# Patient Record
Sex: Female | Born: 1960 | Race: White | Hispanic: No | Marital: Married | Smoking: Former smoker
Health system: Southern US, Community
[De-identification: ages and names within clinical notes are randomized; demographics above are authoritative.]

---

## 2016-11-03 ENCOUNTER — Other Ambulatory Visit (HOSPITAL_COMMUNITY): Payer: Self-pay

## 2016-11-03 ENCOUNTER — Inpatient Hospital Stay
Admission: AD | Admit: 2016-11-03 | Discharge: 2017-01-24 | Disposition: A | Discharge status: Skilled Nursing Facility | Payer: Self-pay | Source: Ambulatory Visit | Attending: Internal Medicine | Admitting: Internal Medicine

## 2016-11-03 DIAGNOSIS — J9690 Respiratory failure, unspecified, unspecified whether with hypoxia or hypercapnia: Secondary | ICD-10-CM

## 2016-11-03 DIAGNOSIS — Z931 Gastrostomy status: Secondary | ICD-10-CM

## 2016-11-03 DIAGNOSIS — R509 Fever, unspecified: Secondary | ICD-10-CM

## 2016-11-03 DIAGNOSIS — J189 Pneumonia, unspecified organism: Secondary | ICD-10-CM

## 2016-11-03 MED ORDER — IOPAMIDOL (ISOVUE-300) INJECTION 61%
INTRAVENOUS | Status: AC
  Administered 2016-11-03: 50 mL via GASTROSTOMY
  Filled 2016-11-03: qty 50

## 2016-11-04 LAB — COMPREHENSIVE METABOLIC PANEL
ALK PHOS: 70 U/L (ref 38–126)
ALT: 47 U/L (ref 14–54)
AST: 27 U/L (ref 15–41)
Albumin: 2.7 g/dL — ABNORMAL LOW (ref 3.5–5.0)
Anion gap: 5 (ref 5–15)
BILIRUBIN TOTAL: 0.4 mg/dL (ref 0.3–1.2)
BUN: 11 mg/dL (ref 6–20)
CALCIUM: 8.8 mg/dL — AB (ref 8.9–10.3)
CO2: 32 mmol/L (ref 22–32)
Chloride: 97 mmol/L — ABNORMAL LOW (ref 101–111)
Glucose, Bld: 80 mg/dL (ref 65–99)
Potassium: 3.4 mmol/L — ABNORMAL LOW (ref 3.5–5.1)
Sodium: 134 mmol/L — ABNORMAL LOW (ref 135–145)
TOTAL PROTEIN: 7.7 g/dL (ref 6.5–8.1)

## 2016-11-04 LAB — TSH: TSH: 14.104 u[IU]/mL — AB (ref 0.350–4.500)

## 2016-11-06 LAB — CBC WITH DIFFERENTIAL/PLATELET
BASOS ABS: 0 10*3/uL (ref 0.0–0.1)
Basophils Relative: 0 %
EOS ABS: 0.2 10*3/uL (ref 0.0–0.7)
EOS PCT: 2 %
HCT: 31.7 % — ABNORMAL LOW (ref 36.0–46.0)
HEMOGLOBIN: 9.8 g/dL — AB (ref 12.0–15.0)
Lymphocytes Relative: 24 %
Lymphs Abs: 2.3 10*3/uL (ref 0.7–4.0)
MCH: 28 pg (ref 26.0–34.0)
MCHC: 30.9 g/dL (ref 30.0–36.0)
MCV: 90.6 fL (ref 78.0–100.0)
Monocytes Absolute: 0.8 10*3/uL (ref 0.1–1.0)
Monocytes Relative: 8 %
NEUTROS PCT: 66 %
Neutro Abs: 6.2 10*3/uL (ref 1.7–7.7)
PLATELETS: 81 10*3/uL — AB (ref 150–400)
RBC: 3.5 MIL/uL — AB (ref 3.87–5.11)
RDW: 13.6 % (ref 11.5–15.5)
WBC: 9.4 10*3/uL (ref 4.0–10.5)

## 2016-11-06 LAB — BASIC METABOLIC PANEL
Anion gap: 7 (ref 5–15)
BUN: 11 mg/dL (ref 6–20)
CO2: 34 mmol/L — ABNORMAL HIGH (ref 22–32)
Calcium: 8.6 mg/dL — ABNORMAL LOW (ref 8.9–10.3)
Chloride: 96 mmol/L — ABNORMAL LOW (ref 101–111)
Glucose, Bld: 146 mg/dL — ABNORMAL HIGH (ref 65–99)
POTASSIUM: 3.5 mmol/L (ref 3.5–5.1)
SODIUM: 137 mmol/L (ref 135–145)

## 2016-11-06 LAB — T4, FREE: FREE T4: 1.09 ng/dL (ref 0.61–1.12)

## 2016-11-07 ENCOUNTER — Other Ambulatory Visit (HOSPITAL_COMMUNITY): Payer: Self-pay

## 2016-11-07 LAB — URINALYSIS, ROUTINE W REFLEX MICROSCOPIC
Bilirubin Urine: NEGATIVE
Glucose, UA: NEGATIVE mg/dL
Ketones, ur: NEGATIVE mg/dL
Nitrite: NEGATIVE
Protein, ur: 100 mg/dL — AB
Specific Gravity, Urine: 1.011 (ref 1.005–1.030)
pH: 7 (ref 5.0–8.0)

## 2016-11-09 LAB — URINE CULTURE: Culture: >=100000 — AB

## 2016-11-12 LAB — CULTURE, BLOOD (ROUTINE X 2)
Culture: NO GROWTH
Culture: NO GROWTH

## 2016-11-13 ENCOUNTER — Other Ambulatory Visit (HOSPITAL_COMMUNITY): Payer: Self-pay

## 2016-11-13 LAB — BASIC METABOLIC PANEL
ANION GAP: 6 (ref 5–15)
Anion gap: 9 (ref 5–15)
BUN: 11 mg/dL (ref 6–20)
BUN: 15 mg/dL (ref 6–20)
CO2: 32 mmol/L (ref 22–32)
CO2: 36 mmol/L — ABNORMAL HIGH (ref 22–32)
CREATININE: 0.32 mg/dL — AB (ref 0.44–1.00)
Calcium: 10.4 mg/dL — ABNORMAL HIGH (ref 8.9–10.3)
Calcium: 8.8 mg/dL — ABNORMAL LOW (ref 8.9–10.3)
Chloride: 95 mmol/L — ABNORMAL LOW (ref 101–111)
Chloride: 112 mmol/L — ABNORMAL HIGH (ref 101–111)
GFR calc Af Amer: >60 mL/min (ref >60)
GFR calc non Af Amer: >60 mL/min (ref >60)
GLUCOSE: 123 mg/dL — AB (ref 65–99)
Glucose, Bld: 92 mg/dL (ref 65–99)
Potassium: 3.6 mmol/L (ref 3.5–5.1)
Potassium: 3.2 mmol/L — ABNORMAL LOW (ref 3.5–5.1)
SODIUM: 136 mmol/L (ref 135–145)
Sodium: 154 mmol/L — ABNORMAL HIGH (ref 135–145)

## 2016-11-13 LAB — CBC
HCT: 34.7 % — ABNORMAL LOW (ref 36.0–46.0)
Hemoglobin: 9.9 g/dL — ABNORMAL LOW (ref 12.0–15.0)
MCH: 28 pg (ref 26.0–34.0)
MCHC: 28.5 g/dL — AB (ref 30.0–36.0)
MCV: 98 fL (ref 78.0–100.0)
PLATELETS: 108 10*3/uL — AB (ref 150–400)
RBC: 3.54 MIL/uL — ABNORMAL LOW (ref 3.87–5.11)
RDW: 14 % (ref 11.5–15.5)
WBC: 8 10*3/uL (ref 4.0–10.5)

## 2016-11-18 LAB — BASIC METABOLIC PANEL
Anion gap: 10 (ref 5–15)
BUN: 18 mg/dL (ref 6–20)
CALCIUM: 9 mg/dL (ref 8.9–10.3)
CO2: 35 mmol/L — ABNORMAL HIGH (ref 22–32)
Chloride: 112 mmol/L — ABNORMAL HIGH (ref 101–111)
Creatinine, Ser: <0.3 mg/dL — ABNORMAL LOW (ref 0.44–1.00)
GLUCOSE: 221 mg/dL — AB (ref 65–99)
POTASSIUM: 3.4 mmol/L — AB (ref 3.5–5.1)
SODIUM: 157 mmol/L — AB (ref 135–145)

## 2016-11-18 LAB — CBC
HCT: 36 % (ref 36.0–46.0)
Hemoglobin: 9.8 g/dL — ABNORMAL LOW (ref 12.0–15.0)
MCH: 27.3 pg (ref 26.0–34.0)
MCHC: 27.2 g/dL — ABNORMAL LOW (ref 30.0–36.0)
MCV: 100.3 fL — ABNORMAL HIGH (ref 78.0–100.0)
PLATELETS: 187 10*3/uL (ref 150–400)
RBC: 3.59 MIL/uL — AB (ref 3.87–5.11)
RDW: 14.1 % (ref 11.5–15.5)
WBC: 9.5 10*3/uL (ref 4.0–10.5)

## 2016-11-20 LAB — BASIC METABOLIC PANEL
ANION GAP: 9 (ref 5–15)
BUN: 17 mg/dL (ref 6–20)
CALCIUM: 8.6 mg/dL — AB (ref 8.9–10.3)
CO2: 32 mmol/L (ref 22–32)
Chloride: 111 mmol/L (ref 101–111)
Creatinine, Ser: <0.3 mg/dL — ABNORMAL LOW (ref 0.44–1.00)
Glucose, Bld: 186 mg/dL — ABNORMAL HIGH (ref 65–99)
POTASSIUM: 3.3 mmol/L — AB (ref 3.5–5.1)
SODIUM: 152 mmol/L — AB (ref 135–145)

## 2016-11-21 LAB — CBC
HCT: 35 % — ABNORMAL LOW (ref 36.0–46.0)
HEMOGLOBIN: 10.1 g/dL — AB (ref 12.0–15.0)
MCH: 28 pg (ref 26.0–34.0)
MCHC: 28.9 g/dL — ABNORMAL LOW (ref 30.0–36.0)
MCV: 97 fL (ref 78.0–100.0)
Platelets: 233 10*3/uL (ref 150–400)
RBC: 3.61 MIL/uL — AB (ref 3.87–5.11)
RDW: 13.7 % (ref 11.5–15.5)
WBC: 9.9 10*3/uL (ref 4.0–10.5)

## 2016-11-21 LAB — TSH: TSH: 12.301 u[IU]/mL — AB (ref 0.350–4.500)

## 2016-11-21 LAB — BASIC METABOLIC PANEL
ANION GAP: 9 (ref 5–15)
BUN: 14 mg/dL (ref 6–20)
CHLORIDE: 108 mmol/L (ref 101–111)
CO2: 34 mmol/L — ABNORMAL HIGH (ref 22–32)
Calcium: 8.7 mg/dL — ABNORMAL LOW (ref 8.9–10.3)
Creatinine, Ser: <0.3 mg/dL — ABNORMAL LOW (ref 0.44–1.00)
Glucose, Bld: 291 mg/dL — ABNORMAL HIGH (ref 65–99)
POTASSIUM: 3.5 mmol/L (ref 3.5–5.1)
SODIUM: 151 mmol/L — AB (ref 135–145)

## 2016-11-22 LAB — URINE CULTURE: CULTURE: NO GROWTH

## 2016-11-23 LAB — COMPREHENSIVE METABOLIC PANEL
ALK PHOS: 81 U/L (ref 38–126)
ALT: 30 U/L (ref 14–54)
ANION GAP: 7 (ref 5–15)
AST: 21 U/L (ref 15–41)
Albumin: 2.4 g/dL — ABNORMAL LOW (ref 3.5–5.0)
BILIRUBIN TOTAL: 0.3 mg/dL (ref 0.3–1.2)
BUN: 16 mg/dL (ref 6–20)
CO2: 33 mmol/L — AB (ref 22–32)
Calcium: 8.7 mg/dL — ABNORMAL LOW (ref 8.9–10.3)
Chloride: 103 mmol/L (ref 101–111)
Creatinine, Ser: 0.32 mg/dL — ABNORMAL LOW (ref 0.44–1.00)
GFR calc Af Amer: >60 mL/min (ref >60)
GFR calc non Af Amer: >60 mL/min (ref >60)
GLUCOSE: 193 mg/dL — AB (ref 65–99)
Potassium: 3.4 mmol/L — ABNORMAL LOW (ref 3.5–5.1)
SODIUM: 143 mmol/L (ref 135–145)
Total Protein: 6.1 g/dL — ABNORMAL LOW (ref 6.5–8.1)

## 2016-11-23 LAB — CBC
HEMATOCRIT: 35 % — AB (ref 36.0–46.0)
HEMOGLOBIN: 10.3 g/dL — AB (ref 12.0–15.0)
MCH: 27.8 pg (ref 26.0–34.0)
MCHC: 29.4 g/dL — AB (ref 30.0–36.0)
MCV: 94.6 fL (ref 78.0–100.0)
Platelets: 208 10*3/uL (ref 150–400)
RBC: 3.7 MIL/uL — AB (ref 3.87–5.11)
RDW: 13.7 % (ref 11.5–15.5)
WBC: 11.2 10*3/uL — ABNORMAL HIGH (ref 4.0–10.5)

## 2016-11-24 LAB — BASIC METABOLIC PANEL
ANION GAP: 8 (ref 5–15)
BUN: 13 mg/dL (ref 6–20)
CALCIUM: 8.6 mg/dL — AB (ref 8.9–10.3)
CO2: 31 mmol/L (ref 22–32)
Chloride: 105 mmol/L (ref 101–111)
Creatinine, Ser: <0.3 mg/dL — ABNORMAL LOW (ref 0.44–1.00)
GLUCOSE: 224 mg/dL — AB (ref 65–99)
Potassium: 4 mmol/L (ref 3.5–5.1)
Sodium: 144 mmol/L (ref 135–145)

## 2016-11-24 LAB — MAGNESIUM: Magnesium: 2.3 mg/dL (ref 1.7–2.4)

## 2016-11-25 LAB — BASIC METABOLIC PANEL
ANION GAP: 7 (ref 5–15)
BUN: 10 mg/dL (ref 6–20)
CO2: 32 mmol/L (ref 22–32)
Calcium: 8.6 mg/dL — ABNORMAL LOW (ref 8.9–10.3)
Chloride: 105 mmol/L (ref 101–111)
Creatinine, Ser: <0.3 mg/dL — ABNORMAL LOW (ref 0.44–1.00)
Glucose, Bld: 224 mg/dL — ABNORMAL HIGH (ref 65–99)
POTASSIUM: 3.7 mmol/L (ref 3.5–5.1)
SODIUM: 144 mmol/L (ref 135–145)

## 2016-11-27 LAB — BASIC METABOLIC PANEL
ANION GAP: 7 (ref 5–15)
BUN: 12 mg/dL (ref 6–20)
CALCIUM: 8.6 mg/dL — AB (ref 8.9–10.3)
CHLORIDE: 102 mmol/L (ref 101–111)
CO2: 32 mmol/L (ref 22–32)
Glucose, Bld: 219 mg/dL — ABNORMAL HIGH (ref 65–99)
Potassium: 3.8 mmol/L (ref 3.5–5.1)
SODIUM: 141 mmol/L (ref 135–145)

## 2016-11-27 LAB — CBC WITH DIFFERENTIAL/PLATELET
BASOS PCT: 0 %
Basophils Absolute: 0 10*3/uL (ref 0.0–0.1)
EOS ABS: 0.1 10*3/uL (ref 0.0–0.7)
Eosinophils Relative: 2 %
HEMATOCRIT: 34.7 % — AB (ref 36.0–46.0)
HEMOGLOBIN: 10.1 g/dL — AB (ref 12.0–15.0)
Lymphocytes Relative: 19 %
Lymphs Abs: 1.7 10*3/uL (ref 0.7–4.0)
MCH: 27.2 pg (ref 26.0–34.0)
MCHC: 29.1 g/dL — AB (ref 30.0–36.0)
MCV: 93.5 fL (ref 78.0–100.0)
MONOS PCT: 9 %
Monocytes Absolute: 0.8 10*3/uL (ref 0.1–1.0)
NEUTROS ABS: 6.3 10*3/uL (ref 1.7–7.7)
NEUTROS PCT: 70 %
Platelets: 214 10*3/uL (ref 150–400)
RBC: 3.71 MIL/uL — ABNORMAL LOW (ref 3.87–5.11)
RDW: 14 % (ref 11.5–15.5)
WBC: 9 10*3/uL (ref 4.0–10.5)

## 2016-12-02 LAB — TSH: TSH: 12.128 u[IU]/mL — ABNORMAL HIGH (ref 0.350–4.500)

## 2016-12-02 LAB — URINALYSIS, ROUTINE W REFLEX MICROSCOPIC
BILIRUBIN URINE: NEGATIVE
GLUCOSE, UA: NEGATIVE mg/dL
HGB URINE DIPSTICK: NEGATIVE
KETONES UR: NEGATIVE mg/dL
Leukocytes, UA: NEGATIVE
NITRITE: NEGATIVE
PH: 9 — AB (ref 5.0–8.0)
Protein, ur: NEGATIVE mg/dL
SPECIFIC GRAVITY, URINE: 1.01 (ref 1.005–1.030)

## 2016-12-08 ENCOUNTER — Other Ambulatory Visit (HOSPITAL_COMMUNITY): Payer: Self-pay

## 2016-12-08 LAB — CBC
HEMATOCRIT: 38.7 % (ref 36.0–46.0)
Hemoglobin: 11.5 g/dL — ABNORMAL LOW (ref 12.0–15.0)
MCH: 27.5 pg (ref 26.0–34.0)
MCHC: 29.7 g/dL — ABNORMAL LOW (ref 30.0–36.0)
MCV: 92.6 fL (ref 78.0–100.0)
PLATELETS: 214 10*3/uL (ref 150–400)
RBC: 4.18 MIL/uL (ref 3.87–5.11)
RDW: 13.7 % (ref 11.5–15.5)
WBC: 12.9 10*3/uL — ABNORMAL HIGH (ref 4.0–10.5)

## 2016-12-08 LAB — BASIC METABOLIC PANEL
ANION GAP: 11 (ref 5–15)
BUN: 14 mg/dL (ref 6–20)
CALCIUM: 9.2 mg/dL (ref 8.9–10.3)
CO2: 35 mmol/L — AB (ref 22–32)
Chloride: 98 mmol/L — ABNORMAL LOW (ref 101–111)
Creatinine, Ser: <0.3 mg/dL — ABNORMAL LOW (ref 0.44–1.00)
GLUCOSE: 159 mg/dL — AB (ref 65–99)
Potassium: 3.6 mmol/L (ref 3.5–5.1)
Sodium: 144 mmol/L (ref 135–145)

## 2016-12-11 ENCOUNTER — Other Ambulatory Visit (HOSPITAL_COMMUNITY): Payer: Self-pay

## 2016-12-11 LAB — BASIC METABOLIC PANEL
Anion gap: 10 (ref 5–15)
BUN: 15 mg/dL (ref 6–20)
CHLORIDE: 98 mmol/L — AB (ref 101–111)
CO2: 35 mmol/L — AB (ref 22–32)
Calcium: 9.3 mg/dL (ref 8.9–10.3)
Creatinine, Ser: <0.3 mg/dL — ABNORMAL LOW (ref 0.44–1.00)
Glucose, Bld: 159 mg/dL — ABNORMAL HIGH (ref 65–99)
Potassium: 3.3 mmol/L — ABNORMAL LOW (ref 3.5–5.1)
Sodium: 143 mmol/L (ref 135–145)

## 2016-12-11 LAB — CBC
HEMATOCRIT: 39.5 % (ref 36.0–46.0)
HEMOGLOBIN: 12.1 g/dL (ref 12.0–15.0)
MCH: 28.3 pg (ref 26.0–34.0)
MCHC: 30.6 g/dL (ref 30.0–36.0)
MCV: 92.5 fL (ref 78.0–100.0)
Platelets: 186 10*3/uL (ref 150–400)
RBC: 4.27 MIL/uL (ref 3.87–5.11)
RDW: 14.1 % (ref 11.5–15.5)
WBC: 12.3 10*3/uL — ABNORMAL HIGH (ref 4.0–10.5)

## 2016-12-19 ENCOUNTER — Other Ambulatory Visit (HOSPITAL_COMMUNITY): Payer: Self-pay

## 2016-12-19 LAB — CBC
HCT: 44.4 % (ref 36.0–46.0)
HEMOGLOBIN: 12.3 g/dL (ref 12.0–15.0)
MCH: 27.7 pg (ref 26.0–34.0)
MCHC: 27.7 g/dL — AB (ref 30.0–36.0)
MCV: 100 fL (ref 78.0–100.0)
PLATELETS: 251 10*3/uL (ref 150–400)
RBC: 4.44 MIL/uL (ref 3.87–5.11)
RDW: 14.3 % (ref 11.5–15.5)
WBC: 10.4 10*3/uL (ref 4.0–10.5)

## 2016-12-19 LAB — BASIC METABOLIC PANEL
Anion gap: 7 (ref 5–15)
BUN: 16 mg/dL (ref 6–20)
CALCIUM: 9.7 mg/dL (ref 8.9–10.3)
CHLORIDE: 99 mmol/L — AB (ref 101–111)
CO2: 44 mmol/L — ABNORMAL HIGH (ref 22–32)
CREATININE: 0.3 mg/dL — AB (ref 0.44–1.00)
GFR calc Af Amer: >60 mL/min (ref >60)
GFR calc non Af Amer: >60 mL/min (ref >60)
Glucose, Bld: 191 mg/dL — ABNORMAL HIGH (ref 65–99)
Potassium: 4 mmol/L (ref 3.5–5.1)
SODIUM: 150 mmol/L — AB (ref 135–145)

## 2016-12-20 ENCOUNTER — Other Ambulatory Visit (HOSPITAL_COMMUNITY): Payer: Self-pay

## 2016-12-20 LAB — BLOOD GAS, ARTERIAL
ACID-BASE EXCESS: 17.1 mmol/L — AB (ref 0.0–2.0)
Acid-base deficit: 17 mmol/L — ABNORMAL HIGH (ref 0.0–2.0)
BICARBONATE: 45.6 mmol/L — AB (ref 20.0–28.0)
Bicarbonate: 40.1 mmol/L — ABNORMAL HIGH (ref 20.0–28.0)
FIO2: 100
FIO2: 50
MECHVT: 450 mL
O2 SAT: 98.9 %
O2 Saturation: 99.3 %
PATIENT TEMPERATURE: 98.6
PCO2 ART: 32.2 mmHg (ref 32.0–48.0)
PEEP/CPAP: 5 cmH2O
PO2 ART: 244 mmHg — AB (ref 83.0–108.0)
PO2 ART: 89.7 mmHg (ref 83.0–108.0)
Patient temperature: 98.6
RATE: 25 resp/min
pH, Arterial: 7.187 — CL (ref 7.350–7.450)
pH, Arterial: 7.693 (ref 7.350–7.450)

## 2016-12-20 LAB — CBC
HEMATOCRIT: 40.7 % (ref 36.0–46.0)
Hemoglobin: 10.9 g/dL — ABNORMAL LOW (ref 12.0–15.0)
MCH: 26.8 pg (ref 26.0–34.0)
MCHC: 26.8 g/dL — ABNORMAL LOW (ref 30.0–36.0)
MCV: 100 fL (ref 78.0–100.0)
Platelets: 278 10*3/uL (ref 150–400)
RBC: 4.07 MIL/uL (ref 3.87–5.11)
RDW: 14.4 % (ref 11.5–15.5)
WBC: 12.3 10*3/uL — ABNORMAL HIGH (ref 4.0–10.5)

## 2016-12-20 LAB — COMPREHENSIVE METABOLIC PANEL
ALK PHOS: 92 U/L (ref 38–126)
ALT: 22 U/L (ref 14–54)
ANION GAP: 14 (ref 5–15)
AST: 23 U/L (ref 15–41)
Albumin: 2.9 g/dL — ABNORMAL LOW (ref 3.5–5.0)
BILIRUBIN TOTAL: 0.9 mg/dL (ref 0.3–1.2)
BUN: 33 mg/dL — ABNORMAL HIGH (ref 6–20)
CALCIUM: 9.9 mg/dL (ref 8.9–10.3)
CO2: 36 mmol/L — ABNORMAL HIGH (ref 22–32)
Chloride: 99 mmol/L — ABNORMAL LOW (ref 101–111)
Creatinine, Ser: 0.77 mg/dL (ref 0.44–1.00)
Glucose, Bld: 223 mg/dL — ABNORMAL HIGH (ref 65–99)
Potassium: 4.6 mmol/L (ref 3.5–5.1)
Sodium: 149 mmol/L — ABNORMAL HIGH (ref 135–145)
TOTAL PROTEIN: 6.8 g/dL (ref 6.5–8.1)

## 2016-12-21 ENCOUNTER — Other Ambulatory Visit (HOSPITAL_COMMUNITY): Payer: Self-pay

## 2016-12-21 LAB — URINALYSIS, MICROSCOPIC (REFLEX)

## 2016-12-21 LAB — BASIC METABOLIC PANEL
ANION GAP: 8 (ref 5–15)
BUN: 43 mg/dL — ABNORMAL HIGH (ref 6–20)
CALCIUM: 9.1 mg/dL (ref 8.9–10.3)
CO2: 41 mmol/L — AB (ref 22–32)
CREATININE: 0.55 mg/dL (ref 0.44–1.00)
Chloride: 98 mmol/L — ABNORMAL LOW (ref 101–111)
Glucose, Bld: 184 mg/dL — ABNORMAL HIGH (ref 65–99)
Potassium: 3.5 mmol/L (ref 3.5–5.1)
SODIUM: 147 mmol/L — AB (ref 135–145)

## 2016-12-21 LAB — CBC
HEMATOCRIT: 39.1 % (ref 36.0–46.0)
Hemoglobin: 11.4 g/dL — ABNORMAL LOW (ref 12.0–15.0)
MCH: 27.9 pg (ref 26.0–34.0)
MCHC: 29.2 g/dL — AB (ref 30.0–36.0)
MCV: 95.6 fL (ref 78.0–100.0)
PLATELETS: 279 10*3/uL (ref 150–400)
RBC: 4.09 MIL/uL (ref 3.87–5.11)
RDW: 14.8 % (ref 11.5–15.5)
WBC: 14.1 10*3/uL — AB (ref 4.0–10.5)

## 2016-12-21 LAB — URINALYSIS, ROUTINE W REFLEX MICROSCOPIC
BILIRUBIN URINE: NEGATIVE
GLUCOSE, UA: NEGATIVE mg/dL
KETONES UR: NEGATIVE mg/dL
Nitrite: NEGATIVE
PROTEIN: 100 mg/dL — AB
Specific Gravity, Urine: 1.025 (ref 1.005–1.030)
pH: 8 (ref 5.0–8.0)

## 2016-12-23 LAB — URINE CULTURE: Culture: >=100000 — AB

## 2016-12-24 LAB — BASIC METABOLIC PANEL
Anion gap: 9 (ref 5–15)
BUN: 18 mg/dL (ref 6–20)
CHLORIDE: 98 mmol/L — AB (ref 101–111)
CO2: 34 mmol/L — AB (ref 22–32)
Calcium: 9.1 mg/dL (ref 8.9–10.3)
GLUCOSE: 213 mg/dL — AB (ref 65–99)
POTASSIUM: 3.6 mmol/L (ref 3.5–5.1)
Sodium: 141 mmol/L (ref 135–145)

## 2016-12-26 LAB — POTASSIUM: POTASSIUM: 4.1 mmol/L (ref 3.5–5.1)

## 2016-12-31 ENCOUNTER — Other Ambulatory Visit (HOSPITAL_COMMUNITY): Payer: Self-pay

## 2016-12-31 LAB — CBC WITH DIFFERENTIAL/PLATELET
BASOS PCT: 0 %
Basophils Absolute: 0 10*3/uL (ref 0.0–0.1)
EOS ABS: 0.2 10*3/uL (ref 0.0–0.7)
Eosinophils Relative: 2 %
HCT: 36.8 % (ref 36.0–46.0)
Hemoglobin: 10.9 g/dL — ABNORMAL LOW (ref 12.0–15.0)
LYMPHS ABS: 2.3 10*3/uL (ref 0.7–4.0)
Lymphocytes Relative: 20 %
MCH: 27.2 pg (ref 26.0–34.0)
MCHC: 29.6 g/dL — AB (ref 30.0–36.0)
MCV: 91.8 fL (ref 78.0–100.0)
MONO ABS: 0.9 10*3/uL (ref 0.1–1.0)
MONOS PCT: 8 %
Neutro Abs: 7.9 10*3/uL — ABNORMAL HIGH (ref 1.7–7.7)
Neutrophils Relative %: 70 %
Platelets: 225 10*3/uL (ref 150–400)
RBC: 4.01 MIL/uL (ref 3.87–5.11)
RDW: 14.6 % (ref 11.5–15.5)
WBC: 11.4 10*3/uL — ABNORMAL HIGH (ref 4.0–10.5)

## 2016-12-31 LAB — BASIC METABOLIC PANEL WITH GFR
Anion gap: 11 (ref 5–15)
BUN: 13 mg/dL (ref 6–20)
CO2: 32 mmol/L (ref 22–32)
Calcium: 9.3 mg/dL (ref 8.9–10.3)
Chloride: 96 mmol/L — ABNORMAL LOW (ref 101–111)
Creatinine, Ser: <0.3 mg/dL — ABNORMAL LOW (ref 0.44–1.00)
Glucose, Bld: 157 mg/dL — ABNORMAL HIGH (ref 65–99)
Potassium: 4.3 mmol/L (ref 3.5–5.1)
Sodium: 139 mmol/L (ref 135–145)

## 2017-01-02 LAB — C DIFFICILE QUICK SCREEN W PCR REFLEX
C DIFFICLE (CDIFF) ANTIGEN: POSITIVE — AB
C Diff interpretation: DETECTED
C Diff toxin: POSITIVE — AB

## 2017-01-03 LAB — TSH: TSH: 8.039 u[IU]/mL — ABNORMAL HIGH (ref 0.350–4.500)

## 2017-01-03 LAB — BASIC METABOLIC PANEL
ANION GAP: 13 (ref 5–15)
BUN: 29 mg/dL — ABNORMAL HIGH (ref 6–20)
CALCIUM: 9.3 mg/dL (ref 8.9–10.3)
CO2: 26 mmol/L (ref 22–32)
Chloride: 96 mmol/L — ABNORMAL LOW (ref 101–111)
Creatinine, Ser: 0.98 mg/dL (ref 0.44–1.00)
GLUCOSE: 168 mg/dL — AB (ref 65–99)
POTASSIUM: 4.9 mmol/L (ref 3.5–5.1)
Sodium: 135 mmol/L (ref 135–145)

## 2017-01-04 LAB — BLOOD GAS, ARTERIAL
Acid-Base Excess: 1.3 mmol/L (ref 0.0–2.0)
BICARBONATE: 26.4 mmol/L (ref 20.0–28.0)
FIO2: 28
O2 Saturation: 96.1 %
PH ART: 7.341 — AB (ref 7.350–7.450)
PO2 ART: 85.1 mmHg (ref 83.0–108.0)
Patient temperature: 98.6
pCO2 arterial: 50.2 mmHg — ABNORMAL HIGH (ref 32.0–48.0)

## 2017-01-05 LAB — BASIC METABOLIC PANEL
Anion gap: 11 (ref 5–15)
BUN: 56 mg/dL — ABNORMAL HIGH (ref 6–20)
CALCIUM: 9 mg/dL (ref 8.9–10.3)
CHLORIDE: 102 mmol/L (ref 101–111)
CO2: 23 mmol/L (ref 22–32)
CREATININE: 1.92 mg/dL — AB (ref 0.44–1.00)
GFR calc non Af Amer: 28 mL/min — ABNORMAL LOW (ref >60)
GFR, EST AFRICAN AMERICAN: 32 mL/min — AB (ref >60)
Glucose, Bld: 143 mg/dL — ABNORMAL HIGH (ref 65–99)
Potassium: 3.8 mmol/L (ref 3.5–5.1)
Sodium: 136 mmol/L (ref 135–145)

## 2017-01-05 LAB — CBC
HEMATOCRIT: 31.1 % — AB (ref 36.0–46.0)
HEMOGLOBIN: 9.2 g/dL — AB (ref 12.0–15.0)
MCH: 27 pg (ref 26.0–34.0)
MCHC: 29.6 g/dL — ABNORMAL LOW (ref 30.0–36.0)
MCV: 91.2 fL (ref 78.0–100.0)
Platelets: 146 10*3/uL — ABNORMAL LOW (ref 150–400)
RBC: 3.41 MIL/uL — ABNORMAL LOW (ref 3.87–5.11)
RDW: 15.4 % (ref 11.5–15.5)
WBC: 7.4 10*3/uL (ref 4.0–10.5)

## 2017-01-08 LAB — BASIC METABOLIC PANEL
Anion gap: 9 (ref 5–15)
BUN: 29 mg/dL — ABNORMAL HIGH (ref 6–20)
CALCIUM: 8.8 mg/dL — AB (ref 8.9–10.3)
CO2: 25 mmol/L (ref 22–32)
CREATININE: 0.63 mg/dL (ref 0.44–1.00)
Chloride: 108 mmol/L (ref 101–111)
GFR calc non Af Amer: >60 mL/min (ref >60)
Glucose, Bld: 109 mg/dL — ABNORMAL HIGH (ref 65–99)
Potassium: 4.1 mmol/L (ref 3.5–5.1)
SODIUM: 142 mmol/L (ref 135–145)

## 2017-01-10 LAB — BASIC METABOLIC PANEL
ANION GAP: 5 (ref 5–15)
BUN: 14 mg/dL (ref 6–20)
CO2: 28 mmol/L (ref 22–32)
Calcium: 8.7 mg/dL — ABNORMAL LOW (ref 8.9–10.3)
Chloride: 109 mmol/L (ref 101–111)
Creatinine, Ser: 0.4 mg/dL — ABNORMAL LOW (ref 0.44–1.00)
GFR calc non Af Amer: >60 mL/min (ref >60)
GLUCOSE: 141 mg/dL — AB (ref 65–99)
Potassium: 4.1 mmol/L (ref 3.5–5.1)
SODIUM: 142 mmol/L (ref 135–145)

## 2017-01-14 LAB — BASIC METABOLIC PANEL
Anion gap: 9 (ref 5–15)
BUN: 5 mg/dL — AB (ref 6–20)
CALCIUM: 8.6 mg/dL — AB (ref 8.9–10.3)
CO2: 34 mmol/L — ABNORMAL HIGH (ref 22–32)
CREATININE: 0.31 mg/dL — AB (ref 0.44–1.00)
Chloride: 97 mmol/L — ABNORMAL LOW (ref 101–111)
GFR calc Af Amer: >60 mL/min (ref >60)
Glucose, Bld: 134 mg/dL — ABNORMAL HIGH (ref 65–99)
POTASSIUM: 3.6 mmol/L (ref 3.5–5.1)
SODIUM: 140 mmol/L (ref 135–145)

## 2017-01-14 LAB — CBC
HCT: 33.8 % — ABNORMAL LOW (ref 36.0–46.0)
Hemoglobin: 9.9 g/dL — ABNORMAL LOW (ref 12.0–15.0)
MCH: 26.6 pg (ref 26.0–34.0)
MCHC: 29.3 g/dL — AB (ref 30.0–36.0)
MCV: 90.9 fL (ref 78.0–100.0)
PLATELETS: 261 10*3/uL (ref 150–400)
RBC: 3.72 MIL/uL — ABNORMAL LOW (ref 3.87–5.11)
RDW: 15.1 % (ref 11.5–15.5)
WBC: 13.6 10*3/uL — AB (ref 4.0–10.5)

## 2017-01-17 LAB — TSH: TSH: 11.043 u[IU]/mL — ABNORMAL HIGH (ref 0.350–4.500)

## 2017-01-21 LAB — CBC
HCT: 34.5 % — ABNORMAL LOW (ref 36.0–46.0)
Hemoglobin: 10.2 g/dL — ABNORMAL LOW (ref 12.0–15.0)
MCH: 27 pg (ref 26.0–34.0)
MCHC: 29.6 g/dL — AB (ref 30.0–36.0)
MCV: 91.3 fL (ref 78.0–100.0)
PLATELETS: 309 10*3/uL (ref 150–400)
RBC: 3.78 MIL/uL — ABNORMAL LOW (ref 3.87–5.11)
RDW: 15.6 % — AB (ref 11.5–15.5)
WBC: 9.1 10*3/uL (ref 4.0–10.5)

## 2017-01-21 LAB — BASIC METABOLIC PANEL
Anion gap: 10 (ref 5–15)
BUN: 9 mg/dL (ref 6–20)
CALCIUM: 9.1 mg/dL (ref 8.9–10.3)
CO2: 32 mmol/L (ref 22–32)
CREATININE: 0.44 mg/dL (ref 0.44–1.00)
Chloride: 96 mmol/L — ABNORMAL LOW (ref 101–111)
GFR calc Af Amer: >60 mL/min (ref >60)
GLUCOSE: 204 mg/dL — AB (ref 65–99)
Potassium: 3.8 mmol/L (ref 3.5–5.1)
Sodium: 138 mmol/L (ref 135–145)

## 2017-04-09 ENCOUNTER — Ambulatory Visit (INDEPENDENT_AMBULATORY_CARE_PROVIDER_SITE_OTHER): Payer: Self-pay | Admitting: Podiatry

## 2017-04-09 ENCOUNTER — Encounter: Payer: Self-pay | Admitting: Podiatry

## 2017-04-09 VITALS — BP 104/72 | HR 75

## 2017-04-09 DIAGNOSIS — L600 Ingrowing nail: Secondary | ICD-10-CM

## 2017-04-09 NOTE — Progress Notes (Signed)
  Subjective:  Patient ID: Virginia Stevenson, female    DOB: 1961/07/27,  MRN: [id number] HPI Chief Complaint  Patient presents with  . Nail Problem    possible infection at base of right 1st toe nail     56 y.o. female presents with the above complaint. 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 Was referred for consultation due to infection of R great toenail. Denies other pedal issues.  Patient has a L heel blister/ ulcer that is being cared for by her facility. Left intact during her visit today.   No past medical history on file. No past surgical history on file.  Current Outpatient Prescriptions:  .  aspirin EC 81 MG tablet, Take by mouth., Disp: , Rfl:  .  atorvastatin (LIPITOR) 10 MG tablet, Take by mouth., Disp: , Rfl:   Allergies  Allergen Reactions  . Penicillins Rash   Review of Systems Objective:   Vitals:   04/09/17 1442  BP: 104/72  Pulse: 75   General AA&O x3. Normal mood and affect.  Vascular Dorsalis pedis and posterior tibial pulses barely palpable. Brisk capillary refill to all digits. Pedal hair present at the great toes.  Neurologic Sensation absent 2/2 paralysis.  Dermatologic L heel dressing left intact. Interspaces clear of maceration. R hallux nail ingrowing at both nail borders with erythema. No drainage.  Orthopedic: MMT absent 2/2 paralysis.   Assessment & Plan:  Patient was evaluated and treated and all questions answered.  Ingrown Nail, R -Discussed benefit of removal of the ingrowing aspects of the nail vs risk of procedure in setting of peripheral arterial disease. Discussed possible delayed wound healing. Patient agreed to proceed due to presence of local infection. -Ingrown nail gently avulsed. See procedure note. -Educated on post-procedure care including soaking. Written instructions provided. -Patient to follow up in 2 weeks for nail check.  Procedure: Partial Nail Avulsion R Hallux Location: Right 1st toe both nail  borders. Anesthesia: Marcaine 0.5% plain; 34mL, digital block. Skin Prep: Alcohol. Dressing: triple abx ointment; dry, sterile, dressing with loose coflex. Technique: The toe was prepped with alcohol. No tourniquet was used due to PAD, however healthy bleeding was noted during the procedure. Both nail borders were freed, split with a nail splitter, and excised. Sterile dressing was applied. Disposition: Patient tolerated procedure well. Patient to return in 2 weeks for follow-up.  Return in about 2 weeks (around 04/23/2017).

## 2017-04-09 NOTE — Patient Instructions (Signed)

## 2017-04-23 ENCOUNTER — Ambulatory Visit (INDEPENDENT_AMBULATORY_CARE_PROVIDER_SITE_OTHER): Payer: Self-pay | Admitting: Podiatry

## 2017-04-23 DIAGNOSIS — Z9889 Other specified postprocedural states: Secondary | ICD-10-CM

## 2017-04-23 NOTE — Progress Notes (Signed)
   Subjective:    Patient ID: Patrick JupiterCatherine Dettman, female    DOB: 1960/10/26, 56 y.o.   MRN: [id number]  HPI Chief Complaint  Patient presents with  . Nail Problem    2 wk nail check  seems to be healing well     56 y.o. female returns for the above complaint. States they did not soak her toe at her facility. States they did apply antibiotic ointment and a band-aid.  Review of Systems     Objective:   Physical Exam General AA&O x3. Normal mood and affect.  Vascular Foot warm and well perfused with good capillary refill.  Neurologic Sensation absent 2/2 patient condition  Dermatologic Nail avulsion site healing well without drainage. Slight erythema. Nail bed with overlying soft crust. Left intact. No signs of local infection.  Orthopedic: No tenderness to palpation of the toe.     Assessment & Plan:  S/p Nail Avulsion, R -Healing well. Small area of erythema without evidence of ingrowing nail. -Triple abx and band-aid applied. -Advised to return if redness does not resolve.  Return if symptoms worsen or fail to improve.

## 2017-11-19 IMAGING — CR DG CHEST 1V PORT
1 series · 1 of 1 positions shown · non-contrast
Comparison: 12/08/2016

CLINICAL DATA: Short of breath

EXAM:
PORTABLE CHEST 1 VIEW

[AP]
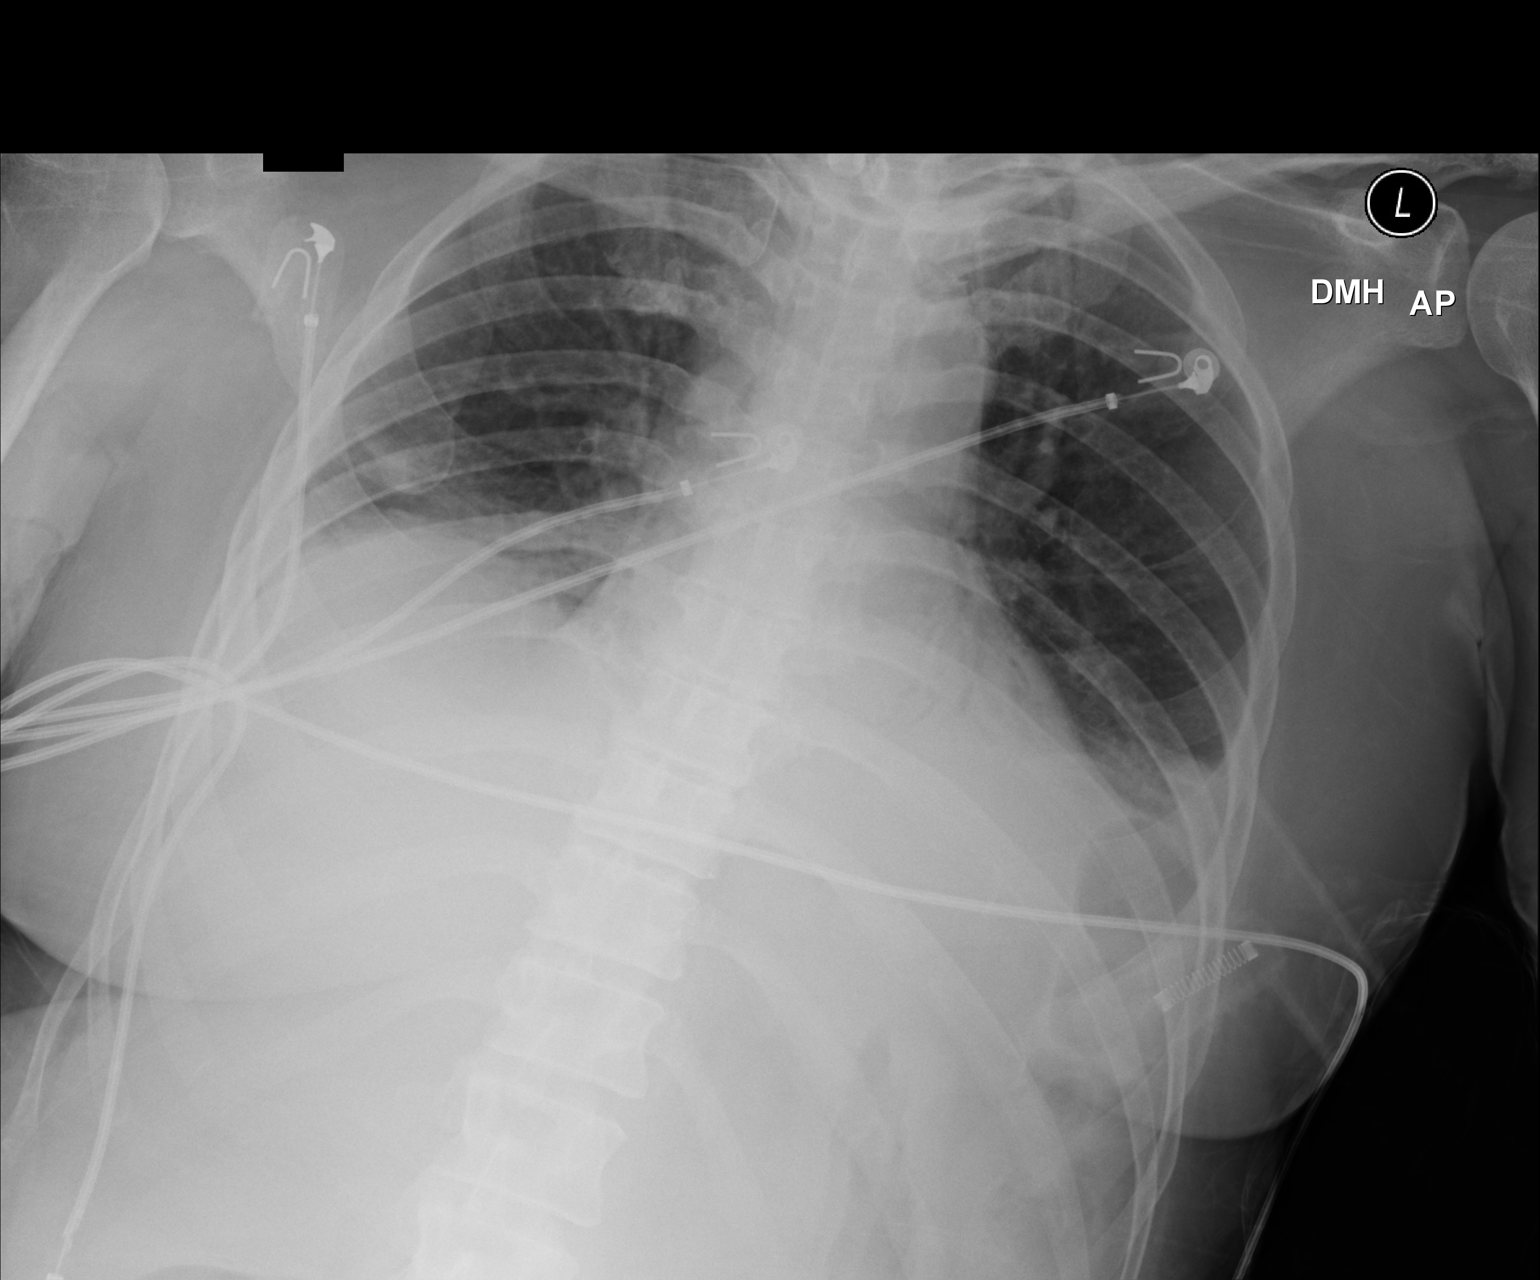

[1 of 1 positions shown; findings below may reference images not displayed]

FINDINGS: Tracheostomy remains in good position. Hypoventilation with
bibasilar atelectasis unchanged. Small left pleural effusion.
Negative for edema.
IMPRESSION: Bibasilar atelectasis unchanged from the prior study.

## 2017-11-27 IMAGING — CR DG CHEST 1V PORT
1 series · 1 of 1 positions shown · non-contrast
Comparison: 12/11/2016

CLINICAL DATA: Shortness of breath, weakness and respiratory
failure.

EXAM:
PORTABLE CHEST 1 VIEW

[AP]
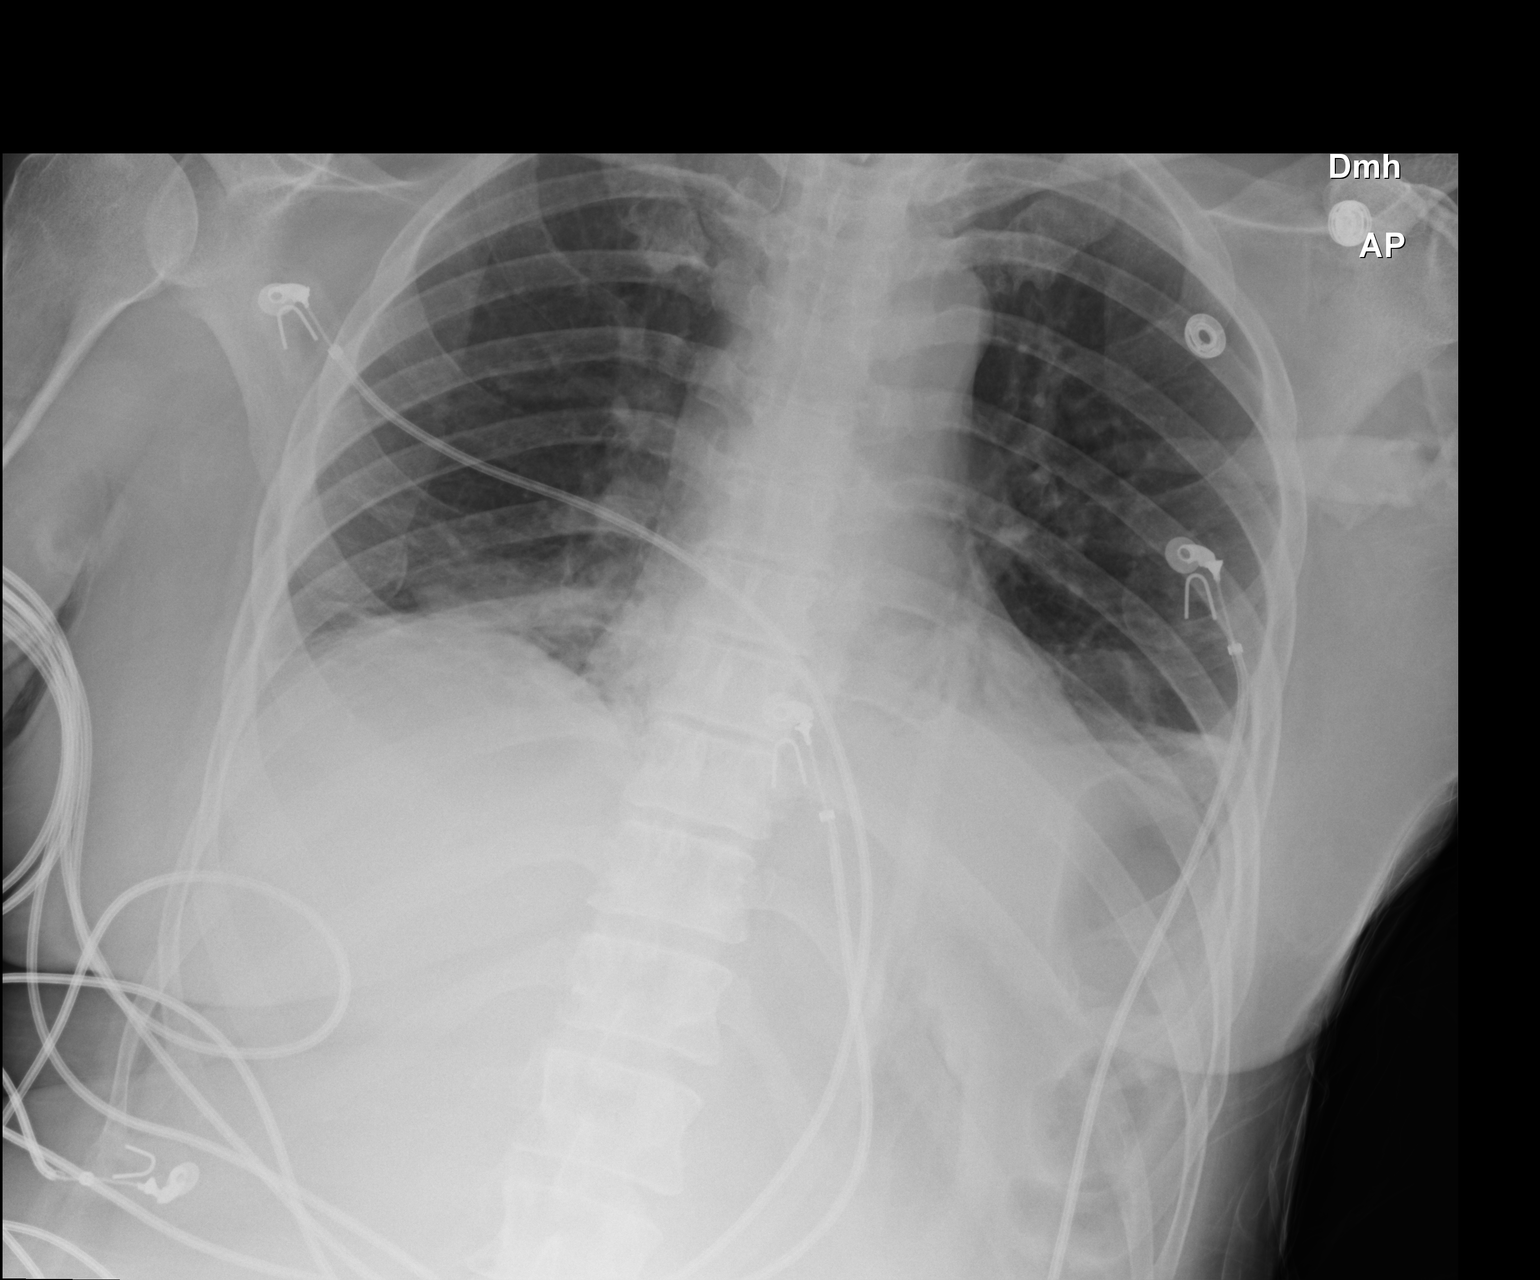

[1 of 1 positions shown; findings below may reference images not displayed]

FINDINGS: Tracheostomy tube is present. Again noted are patchy bibasilar
densities with air bronchograms at left lung base. Mid and upper
lungs remain clear. Heart size is within normal limits. The trachea
is midline.
IMPRESSION: Persistent bibasilar lung densities with minimal change. Findings
are suggestive for atelectasis with some consolidation at the left
lung base. Cannot exclude small effusions.

## 2017-11-28 IMAGING — CR DG CHEST 1V PORT
1 series · 1 of 1 positions shown · non-contrast
Comparison: Yesterday

CLINICAL DATA: Respiratory failure

EXAM:
PORTABLE CHEST 1 VIEW

[AP]
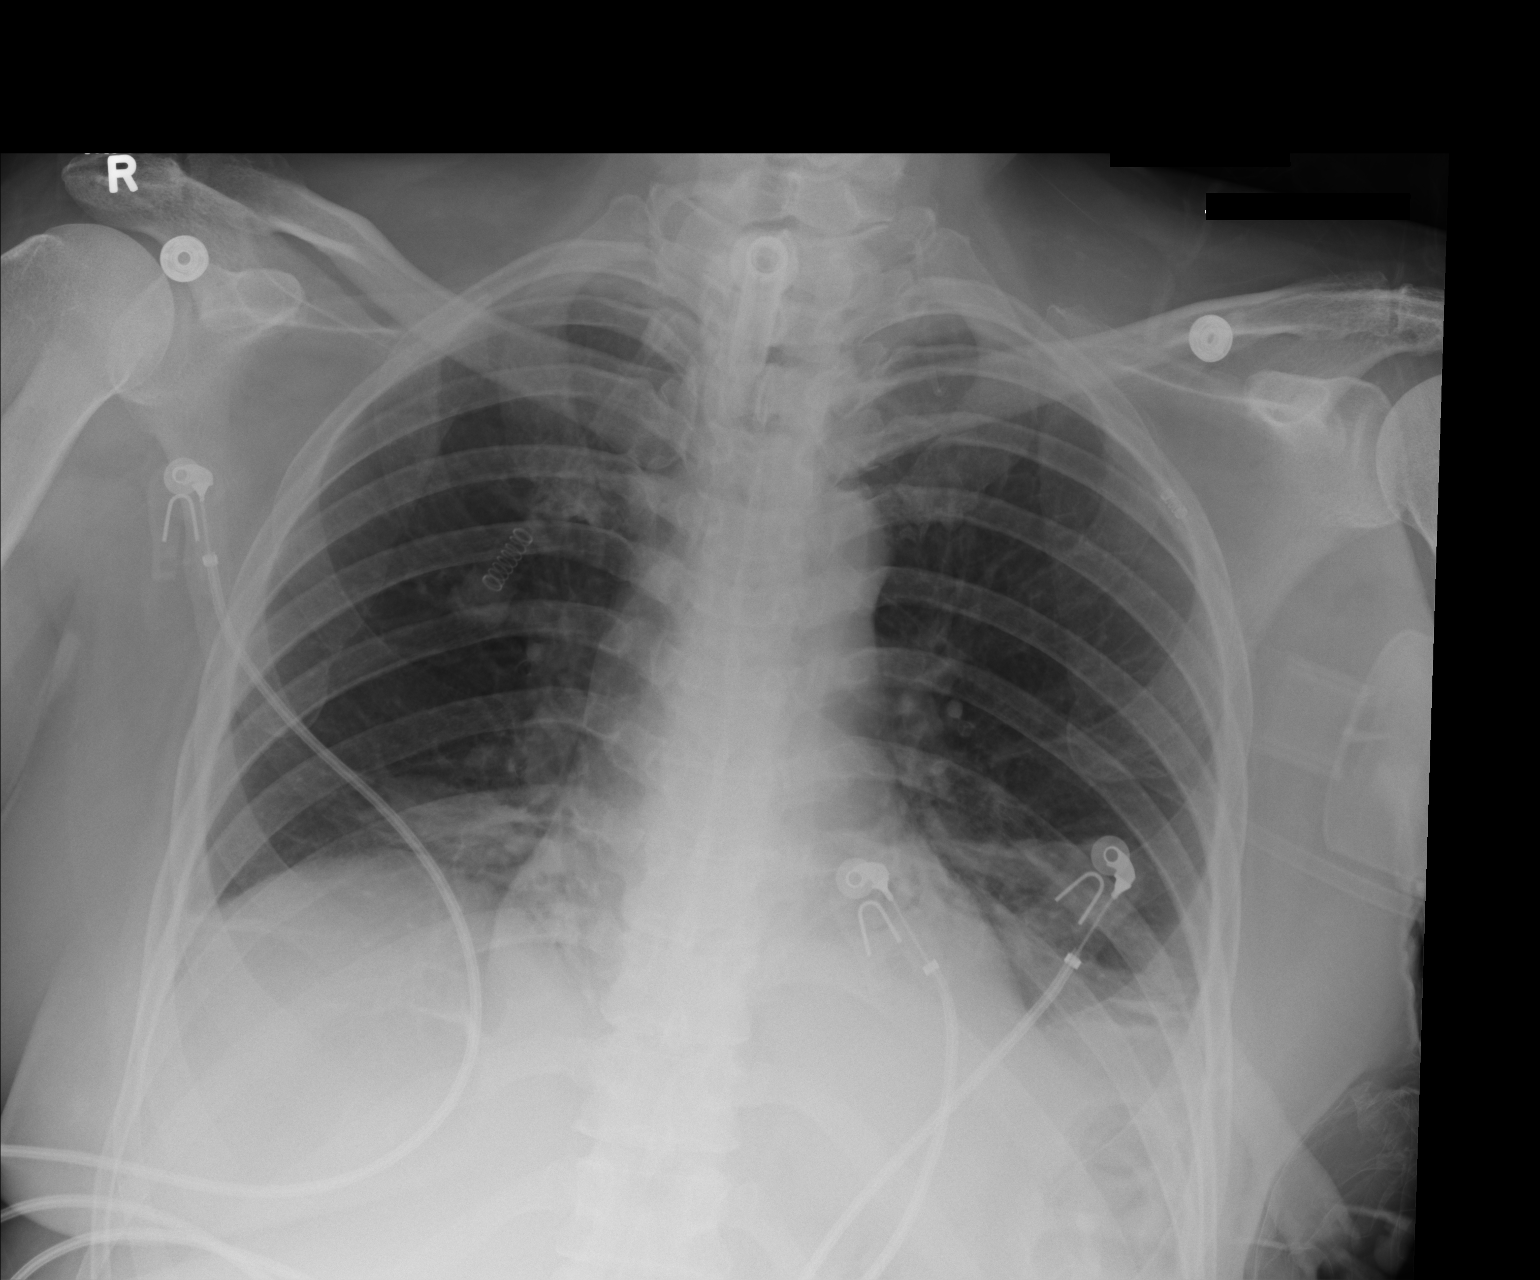

[1 of 1 positions shown; findings below may reference images not displayed]

FINDINGS: Low volume chest with streaky lower lobe opacities, similar to
prior. Air bronchogram seen at the left base with crowding. No
edema, effusion, or pneumothorax. Normal heart size and mediastinal
contours. Tracheostomy tube present.
IMPRESSION: Unchanged low volume chest with moderate atelectatic type opacities
at the bases.
